# Patient Record
Sex: Male | Born: 1975 | Race: White | Hispanic: No | Marital: Married | Smoking: Current every day smoker
Health system: Southern US, Community
[De-identification: ages and names within clinical notes are randomized; demographics above are authoritative.]

---

## 2004-02-10 ENCOUNTER — Emergency Department (HOSPITAL_COMMUNITY): Admission: EM | Admit: 2004-02-10 | Discharge: 2004-02-10 | Payer: Self-pay | Admitting: Emergency Medicine

## 2007-07-19 ENCOUNTER — Emergency Department (HOSPITAL_COMMUNITY): Admission: EM | Admit: 2007-07-19 | Discharge: 2007-07-19 | Payer: Self-pay | Admitting: Emergency Medicine

## 2008-02-28 ENCOUNTER — Encounter: Admission: RE | Admit: 2008-02-28 | Discharge: 2008-02-28 | Payer: Self-pay | Admitting: Family Medicine

## 2008-04-10 ENCOUNTER — Encounter
Admission: RE | Admit: 2008-04-10 | Discharge: 2008-04-10 | Payer: Self-pay | Admitting: Physical Medicine and Rehabilitation

## 2009-07-31 ENCOUNTER — Encounter: Admission: RE | Admit: 2009-07-31 | Discharge: 2009-07-31 | Payer: Self-pay | Admitting: Family Medicine

## 2010-04-13 ENCOUNTER — Emergency Department (HOSPITAL_COMMUNITY)
Admission: EM | Admit: 2010-04-13 | Discharge: 2010-04-14 | Disposition: A | Discharge status: Home / Self Care | Payer: 59 | Attending: Emergency Medicine | Admitting: Emergency Medicine

## 2010-04-13 DIAGNOSIS — R4182 Altered mental status, unspecified: Secondary | ICD-10-CM | POA: Insufficient documentation

## 2010-04-13 DIAGNOSIS — F1010 Alcohol abuse, uncomplicated: Secondary | ICD-10-CM | POA: Insufficient documentation

## 2010-04-13 LAB — ETHANOL: Alcohol, Ethyl (B): 176 mg/dL — ABNORMAL HIGH (ref 0–10)

## 2010-04-14 LAB — RAPID URINE DRUG SCREEN, HOSP PERFORMED
Amphetamines: NOT DETECTED
Barbiturates: NOT DETECTED
Benzodiazepines: NOT DETECTED
Opiates: NOT DETECTED
Tetrahydrocannabinol: NOT DETECTED

## 2014-08-02 ENCOUNTER — Other Ambulatory Visit: Payer: Self-pay | Admitting: Family Medicine

## 2014-08-02 DIAGNOSIS — R110 Nausea: Secondary | ICD-10-CM

## 2014-08-02 DIAGNOSIS — R1011 Right upper quadrant pain: Secondary | ICD-10-CM

## 2014-08-07 ENCOUNTER — Ambulatory Visit (HOSPITAL_COMMUNITY)
Admission: RE | Admit: 2014-08-07 | Discharge: 2014-08-07 | Disposition: A | Discharge status: Home / Self Care | Payer: 59 | Source: Ambulatory Visit | Attending: Family Medicine | Admitting: Family Medicine

## 2014-08-07 ENCOUNTER — Other Ambulatory Visit (HOSPITAL_COMMUNITY): Payer: Self-pay | Admitting: Family Medicine

## 2014-08-07 DIAGNOSIS — R509 Fever, unspecified: Secondary | ICD-10-CM

## 2014-08-07 DIAGNOSIS — R112 Nausea with vomiting, unspecified: Secondary | ICD-10-CM | POA: Diagnosis not present

## 2014-08-07 DIAGNOSIS — R1011 Right upper quadrant pain: Secondary | ICD-10-CM

## 2014-08-07 DIAGNOSIS — R197 Diarrhea, unspecified: Secondary | ICD-10-CM

## 2014-08-07 DIAGNOSIS — R110 Nausea: Secondary | ICD-10-CM

## 2014-08-07 DIAGNOSIS — K760 Fatty (change of) liver, not elsewhere classified: Secondary | ICD-10-CM | POA: Insufficient documentation

## 2014-08-11 ENCOUNTER — Other Ambulatory Visit: Payer: Self-pay

## 2014-09-08 ENCOUNTER — Other Ambulatory Visit: Payer: Self-pay | Admitting: Gastroenterology

## 2014-09-08 DIAGNOSIS — R1011 Right upper quadrant pain: Secondary | ICD-10-CM

## 2014-09-26 ENCOUNTER — Ambulatory Visit (HOSPITAL_COMMUNITY)
Admission: RE | Admit: 2014-09-26 | Discharge: 2014-09-26 | Disposition: A | Discharge status: Home / Self Care | Payer: 59 | Source: Ambulatory Visit | Attending: Gastroenterology | Admitting: Gastroenterology

## 2014-09-26 DIAGNOSIS — R1011 Right upper quadrant pain: Secondary | ICD-10-CM | POA: Diagnosis present

## 2014-09-26 MED ORDER — STERILE WATER FOR INJECTION IJ SOLN
INTRAMUSCULAR | Status: AC
  Filled 2014-09-26: qty 10

## 2014-09-26 MED ORDER — TECHNETIUM TC 99M MEBROFENIN IV KIT
5.0000 | PACK | Freq: Once | INTRAVENOUS | Status: AC | PRN
  Administered 2014-09-26: 5 via INTRAVENOUS

## 2014-09-26 MED ORDER — SINCALIDE 5 MCG IJ SOLR
INTRAMUSCULAR | Status: AC
  Filled 2014-09-26: qty 5

## 2014-10-09 ENCOUNTER — Other Ambulatory Visit: Payer: Self-pay | Admitting: Gastroenterology

## 2014-10-09 DIAGNOSIS — R1084 Generalized abdominal pain: Secondary | ICD-10-CM

## 2014-10-10 ENCOUNTER — Ambulatory Visit
Admission: RE | Admit: 2014-10-10 | Discharge: 2014-10-10 | Disposition: A | Discharge status: Home / Self Care | Payer: 59 | Source: Ambulatory Visit | Attending: Gastroenterology | Admitting: Gastroenterology

## 2014-10-10 DIAGNOSIS — R1084 Generalized abdominal pain: Secondary | ICD-10-CM

## 2014-10-10 MED ORDER — IOPAMIDOL (ISOVUE-300) INJECTION 61%
100.0000 mL | Freq: Once | INTRAVENOUS | Status: AC | PRN
  Administered 2014-10-10: 100 mL via INTRAVENOUS

## 2016-03-26 ENCOUNTER — Other Ambulatory Visit: Payer: Self-pay | Admitting: Neurology

## 2016-03-26 DIAGNOSIS — M6281 Muscle weakness (generalized): Secondary | ICD-10-CM

## 2016-03-26 DIAGNOSIS — R200 Anesthesia of skin: Secondary | ICD-10-CM

## 2016-04-01 ENCOUNTER — Ambulatory Visit (HOSPITAL_COMMUNITY)
Admission: RE | Admit: 2016-04-01 | Discharge: 2016-04-01 | Disposition: A | Discharge status: Home / Self Care | Payer: 59 | Source: Ambulatory Visit | Attending: Neurology | Admitting: Neurology

## 2016-04-01 DIAGNOSIS — M6281 Muscle weakness (generalized): Secondary | ICD-10-CM

## 2016-04-01 DIAGNOSIS — G114 Hereditary spastic paraplegia: Secondary | ICD-10-CM | POA: Insufficient documentation

## 2016-04-01 DIAGNOSIS — M2578 Osteophyte, vertebrae: Secondary | ICD-10-CM | POA: Insufficient documentation

## 2016-04-01 DIAGNOSIS — G589 Mononeuropathy, unspecified: Secondary | ICD-10-CM | POA: Insufficient documentation

## 2016-04-01 DIAGNOSIS — M50223 Other cervical disc displacement at C6-C7 level: Secondary | ICD-10-CM | POA: Diagnosis not present

## 2016-04-01 DIAGNOSIS — M4802 Spinal stenosis, cervical region: Secondary | ICD-10-CM | POA: Diagnosis not present

## 2016-04-01 DIAGNOSIS — R200 Anesthesia of skin: Secondary | ICD-10-CM

## 2016-04-01 MED ORDER — GADOBENATE DIMEGLUMINE 529 MG/ML IV SOLN
20.0000 mL | Freq: Once | INTRAVENOUS | Status: AC | PRN
  Administered 2016-04-01: 17 mL via INTRAVENOUS

## 2016-05-14 ENCOUNTER — Encounter: Payer: Self-pay | Admitting: Physical Therapy

## 2016-05-14 ENCOUNTER — Ambulatory Visit: Payer: 59 | Attending: Neurology | Admitting: Physical Therapy

## 2016-05-14 DIAGNOSIS — M6281 Muscle weakness (generalized): Secondary | ICD-10-CM

## 2016-05-14 DIAGNOSIS — G478 Other sleep disorders: Secondary | ICD-10-CM | POA: Diagnosis present

## 2016-05-14 NOTE — Addendum Note (Signed)
Addended by: Ezekiel InaMANSFIELD, Astra Gregg S on: 05/14/2016 05:52 PM   Modules accepted: Orders

## 2016-05-14 NOTE — Therapy (Addendum)
[facility] MAIN [facility] SERVICES [address] [place], [place], [address] Phone: [phone]   Fax:  [phone]  Physical Therapy Evaluation  Patient Details  Name: Shane Bass MRN: [id number] Date of Birth: 1975/10/07 Referring Provider: Cristopher Peru K  Encounter Date: 05/14/2016      PT End of Session - 05/14/16 1606    Visit Number 1   Number of Visits 17   Date for PT Re-Evaluation 07/09/16   Authorization Type UHC   PT Start Time 0345   PT Stop Time 0445   PT Time Calculation (min) 60 min   Activity Tolerance Patient tolerated treatment well;Treatment limited secondary to medical complications (Comment)      History reviewed. No pertinent past medical history.  History reviewed. No pertinent surgical history.  There were no vitals filed for this visit.       Subjective Assessment - 05/14/16 1554    Subjective Patient reports that his right knee cap "pops".  He has pain that travels up his leg and down his legs. His legs sometimes feel like it is giving away.    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 He  had therapy  a year and a half for stretching and now he feels like he is getting worse.  He feels like his legs are getting tighter and the  right leg feels like it can give away. Patient states that his symptoms have been present for years. He started having numbness, tingling, pain, weakness, and decreased flexibility which started in his legs bilaterally. Patient has aching pain, sensation of swollen body part, weakness, cramps, twitching of muscle and urinary urgency. Patient says that his symptoms came on gradually and and has gotten worse. Patient says stretching and taking meloxicam makes him better. Patient has not had any imaging done for symptoms.    Limitations Walking;Standing   How long can you sit comfortably? unlimited   How  long can you stand comfortably? most of the day   How long can you walk comfortably? most of the day , on and off   Patient Stated Goals to be able to touch his toes and walk better   Currently in Pain? Yes   Pain Score 0-No pain   Aggravating Factors  reaching for his toes   Pain Relieving Factors sitting   Effect of Pain on Daily Activities stops him from doing any sports, he limits himself from beng active            [facility] PT Assessment - 05/14/16 0001      Assessment   Medical Diagnosis Hereditary Spastic Paraparisis   Referring Provider shah hemang K   Onset Date/Surgical Date 04/30/16   Hand Dominance Right   Next MD Visit [facility], HEMANG K      Balance Screen   Has the patient fallen in the past 6 months No   Has the patient had a decrease in activity level because of a fear of falling?  Yes   Is the patient reluctant to leave their home because of a fear of falling?  No     Home Nurse, mental health Private residence   Living Arrangements Spouse/significant other   Available Help at Discharge Family   Type of Home House   Home Access Stairs to enter   Entrance Stairs-Number of Steps --  2   Entrance Stairs-Rails  None   Home Layout One level     Prior Function   Level of Independence Independent   Vocation Full time employment   Vocation Requirements --  walking, carrying, climbing steps, climbing ladder      PAIN: no reports of pain  POSTURE: WNL   PROM/AROM:  Decreased  Hamstring flexibility 0-70 deg RLE, 0-80 deg RLE  STRENGTH:  Graded on a 0-5 scale Muscle Group Left Right                          Hip Flex 5/5 5/5  Hip Abd 5/5 4/5  Hip Add 4/5 4/5  Hip Ext 5/5 5/5  Hip IR/ER 5/5 5/5  Knee Flex 5/5 4/5  Knee Ext 4/5 4/5  Ankle DF 5/5 5/5  Ankle PF 5/5 5/5   SENSATION: WNL   FUNCTIONAL MOBILITY: independent   BALANCE:WNL   GAIT: WNL  LEFS: need to have completed at next visit   ,.                     PT Education - 05/14/16 1606    Education provided Yes   Education Details plan of care   Person(s) Educated Patient   Methods Explanation   Comprehension Verbalized understanding             PT Long Term Goals - 05/14/16 1734      PT LONG TERM GOAL #1   Title Patient will be independent in home exercise program to improve strength/mobility for better functional independence with ADLs.   Time 8   Period Weeks   Status New     PT LONG TERM GOAL #2   Title Patient will increase BLE gross strength to 4+/5 as to improve functional strength for independent gait, increased standing tolerance and increased ADL ability   Baseline 4/5 right knee flex/ext   Time 8   Status New     PT LONG TERM GOAL #3   Title Patient will increase lower extremity functional scale to >60/80 to demonstrate improved functional mobility and increased tolerance with ADLs.    Time 8   Period Weeks   Status New     PT LONG TERM GOAL #4   Title Patient will report a worst pain of 3/10 on VAS in RLE to improve tolerance with ADLs and reduced symptoms with activities.    Baseline variable 6/10    Time 8   Period Weeks   Status New               Plan - 05/14/16 1635    Clinical Impression Statement Patient is 41 yr old male with dx of Hereditary Spastic Paraparisis. He has tightness in BLE hamstrings, decreased RLE knee flex and extension strength. He reports right knee popping during ascending and descending steps. Patient will benefit from skilled PT to improve flexibiity in BLE hamstring muscles and strengthen RLE to be able to reach goals.    Rehab Potential Good   Clinical Impairments Affecting Rehab Potential This patient presents with 1 personal factors/ comorbidities current situation, and 2 body elements including body structures and functions, activity limitations and or participation restrictions: decreased strength RLE and decreased flexibility in BLE hamstring. Patient's condition is  stable,.   PT Frequency 2x / week   PT Duration 8 weeks   PT Treatment/Interventions Patient/family education;Therapeutic activities;Therapeutic exercise;Ultrasound;Moist Heat;Electrical Stimulation;Cryotherapy;Aquatic Therapy   PT Next Visit Plan RLE quad strengthening  PT Home Exercise Plan interval SLR, hamstring stretching   Consulted and Agree with Plan of Care Patient      Patient will benefit from skilled therapeutic intervention in order to improve the following deficits and impairments:  Difficulty walking, Decreased range of motion, Decreased activity tolerance, Decreased strength, Impaired flexibility, Pain  Visit Diagnosis: Muscle weakness (generalized) - Plan: PT plan of care cert/re-cert  Difficulty waking - Plan: PT plan of care cert/re-cert     Problem List There are no active problems to display for this patient.  [name], PT, DPT [place], Barkley Bruns S 05/14/2016, 5:43 PM  [place] [facility] MAIN [facility] SERVICES [address] [place], [place], [address] Phone: [phone]   Fax:  [phone]  Name: Shane Bass MRN: [id number] Date of Birth: December 01, 1975

## 2016-05-20 ENCOUNTER — Ambulatory Visit: Payer: 59 | Admitting: Physical Therapy

## 2016-05-20 ENCOUNTER — Encounter: Payer: Self-pay | Admitting: Physical Therapy

## 2016-05-20 DIAGNOSIS — G478 Other sleep disorders: Secondary | ICD-10-CM

## 2016-05-20 DIAGNOSIS — M6281 Muscle weakness (generalized): Secondary | ICD-10-CM

## 2016-05-20 NOTE — Therapy (Signed)
[facility] MAIN [facility] SERVICES [address] [place], [place], [address] Phone: [phone]   Fax:  [phone]  Physical Therapy Treatment  Patient Details  Name: Shane Bass MRN: [id number] Date of Birth: 1976/02/02 Referring Provider: Cristopher Peru K  Encounter Date: 05/20/2016      PT End of Session - 05/20/16 1632    Visit Number 2   Number of Visits 17   Date for PT Re-Evaluation 07/09/16   Authorization Type UHC   PT Start Time 0430   PT Stop Time 0510   PT Time Calculation (min) 40 min   Activity Tolerance Patient tolerated treatment well;Treatment limited secondary to medical complications (Comment)      History reviewed. No pertinent past medical history.  History reviewed. No pertinent surgical history.  There were no vitals filed for this visit.      Subjective Assessment - 05/20/16 1630    Subjective Patient reports that he has been doing his stretches. He is not having any pain today.   Pertinent History Patient says that he cant take long steps because his leg feel so tight. He has had therapy  a year and a half for stretching. He feels like his legs are getting tighter and the  right leg feels like it can give away.    Limitations Walking;Standing   How long can you sit comfortably? unlimited   How long can you stand comfortably? most of the day   How long can you walk comfortably? most of the day , on and off   Patient Stated Goals to be able to touch his toes and walk better   Currently in Pain? No/denies   Pain Score 0-No pain     Treatment: Hamstring stretching on step x 30 sec x 3 BLE gastroc/ soleus stretch  X 30 x 3 Low back stretch x 30 sec x 3  SLR intervals x 2 with 3 lbs BLE x 10  sidelying hip abd with 3 lbs x 20 BLE Hip abd/ER with GTB x 20 hooklying marching x 20  Leg press x 100 lbs x 20 BLE and heel raises x 20 x 3 Matrix hip ext x 20 , hip abd x 20 left and right x 20 x 2  Patient needs  cues for posture correction and for correct exercise technique.                           PT Education - 05/20/16 1631    Education provided Yes   Education Details HEP   Person(s) Educated Patient   Methods Explanation   Comprehension Verbalized understanding             PT Long Term Goals - 05/14/16 1734      PT LONG TERM GOAL #1   Title Patient will be independent in home exercise program to improve strength/mobility for better functional independence with ADLs.   Time 8   Period Weeks   Status New     PT LONG TERM GOAL #2   Title Patient will increase BLE gross strength to 4+/5 as to improve functional strength for independent gait, increased standing tolerance and increased ADL ability   Baseline 4/5 right knee flex/ext   Time 8   Status New     PT LONG TERM GOAL #3   Title Patient will increase lower extremity functional scale to >60/80 to demonstrate improved functional mobility and increased tolerance with  ADLs.    Time 8   Period Weeks   Status New     PT LONG TERM GOAL #4   Title Patient will report a worst pain of 3/10 on VAS in RLE to improve tolerance with ADLs and reduced symptoms with activities.    Baseline variable 6/10    Time 8   Period Weeks   Status New               Plan - 05/20/16 1632    Clinical Impression Statement Patient instructed in LE stretching and strengthening iwth minimal cues for posture and correct exercise technique and positions. Patient will benefit from skiled PT to improve flexibility and increase strength.    Rehab Potential Good   Clinical Impairments Affecting Rehab Potential This patient presents with 1 personal factors/ comorbidities current situation, and 2 body elements including body structures and functions, activity limitations and or participation restrictions: decreased strength RLE and decreased flexibility in BLE hamstring. Patient's condition is stable,.   PT Frequency 2x / week    PT Duration 8 weeks   PT Treatment/Interventions Patient/family education;Therapeutic activities;Therapeutic exercise;Ultrasound;Moist Heat;Electrical Stimulation;Cryotherapy;Aquatic Therapy   PT Next Visit Plan RLE quad strengthening   PT Home Exercise Plan interval SLR, hamstring stretching   Consulted and Agree with Plan of Care Patient      Patient will benefit from skilled therapeutic intervention in order to improve the following deficits and impairments:  Difficulty walking, Decreased range of motion, Decreased activity tolerance, Decreased strength, Impaired flexibility, Pain  Visit Diagnosis: Muscle weakness (generalized)  Difficulty waking     Problem List There are no active problems to display for this patient.  Ezekiel InaKristine S Armoni Kludt, PT, DPT [place], [place] 05/20/2016, 5:11 PM  [place] [facility]IN [facility] [address] [place], [place], [address] Phone: [phone]   Fax:  [phone]  Name: Cornell BarmanBradley Corne MRN: [id number] Date of Birth: May 14, 1975

## 2016-05-22 ENCOUNTER — Ambulatory Visit: Payer: 59 | Admitting: Physical Therapy

## 2016-05-22 ENCOUNTER — Encounter: Payer: Self-pay | Admitting: Physical Therapy

## 2016-05-22 ENCOUNTER — Ambulatory Visit: Payer: 59 | Attending: Neurology | Admitting: Physical Therapy

## 2016-05-22 DIAGNOSIS — G478 Other sleep disorders: Secondary | ICD-10-CM | POA: Insufficient documentation

## 2016-05-22 DIAGNOSIS — M6281 Muscle weakness (generalized): Secondary | ICD-10-CM | POA: Diagnosis present

## 2016-05-22 NOTE — Therapy (Signed)
[facility] MAIN [facility] [address] [place], [place], [address] Phone: [phone]   Fax:  [phone]  Physical Therapy Treatment  Patient Details  Name: Shane Bass MRN: [id number] Date of Birth: 03-08-1975 Referring Provider: Cristopher Perushah hemang Bass  Encounter Date: 05/22/2016      PT End of Session - 05/22/16 1535    Visit Number 3   Number of Visits 17   Date for PT Re-Evaluation 07/09/16   Authorization Type UHC   PT Start Time 0330   PT Stop Time 0415   PT Time Calculation (min) 45 min   Equipment Utilized During Treatment Gait belt   Activity Tolerance Patient tolerated treatment well;Treatment limited secondary to medical complications (Comment)      History reviewed. No pertinent past medical history.  History reviewed. No pertinent surgical history.  There were no vitals filed for this visit.      Subjective Assessment - 05/22/16 1531    Subjective Patient reports that he has been doing his stretches. He is not having any pain today.   Pertinent History Patient says that he cant take long steps because his leg feel so tight. He has had therapy  a year and a half for stretching. He feels like his legs are getting tighter and the  right leg feels like it can give away.    Limitations Walking;Standing   How long can you sit comfortably? unlimited   How long can you stand comfortably? most of the day   How long can you walk comfortably? most of the day , on and off   Patient Stated Goals to be able to touch his toes and walk better   Currently in Pain? Yes   Pain Score 4    Pain Location Buttocks   Pain Orientation Right   Pain Descriptors / Indicators Sore   Pain Type Acute pain   Pain Radiating Towards not radiating   Pain Onset Today   Pain Frequency Constant   Aggravating Factors  reaching for his toes   Pain Relieving Factors sitting   Effect of Pain on Daily Activities stops him from running        Treatment BOSU ball flat side up, flat side down with UE support and squats x 15 x 2; cues for upright posture BOSU ball lunges x 15 BLE with cues for upright posture tandem standing on green and blue disk with trunk rotation and cues for upright posture Leg press 150 lbs x 20 x 3, heel raises 150 lbs x 20 x 3 TM walking side stepping left and right x 3 mins each direction with elevation 2 , x 3 mins left and right , UE support TM walking elevation 2, 5 mins left and right with cue for looking ahead and UE support matix 12. 5 lbs hip exprior to t/ hip abd x 10 x 2 BLE; cues for posture and keeping leg in extension Patient is having pain today , but is able to perform all exercises and does have some fatigue.                            PT Education - 05/22/16 1534    Education provided Yes   Education Details HEP   Person(Bass) Educated Patient   Methods Explanation   Comprehension Verbalized understanding             PT Long Term Goals - 05/14/16 1734  PT LONG TERM GOAL #1   Title Patient will be independent in home exercise program to improve strength/mobility for better functional independence with ADLs.   Time 8   Period Weeks   Status New     PT LONG TERM GOAL #2   Title Patient will increase BLE gross strength to 4+/5 as to improve functional strength for independent gait, increased standing tolerance and increased ADL ability   Baseline 4/5 right knee flex/ext   Time 8   Status New     PT LONG TERM GOAL #3   Title Patient will increase lower extremity functional scale to >60/80 to demonstrate improved functional mobility and increased tolerance with ADLs.    Time 8   Period Weeks   Status New     PT LONG TERM GOAL #4   Title Patient will report a worst pain of 3/10 on VAS in RLE to improve tolerance with ADLs and reduced symptoms with activities.    Baseline variable 6/10    Time 8   Period Weeks   Status New                Plan - 05/22/16 1536    Clinical Impression Statement Patient presents with unsteadiness in gait and weakness in BLE and fatigues with therapeutic exercises.  Patient tolerated  interventions for strengtening well this date and will benefit from continued skilled PT interventions to improve strength .   Rehab Potential Good   Clinical Impairments Affecting Rehab Potential This patient presents with 1 personal factors/ comorbidities current situation, and 2 body elements including body structures and functions, activity limitations and or participation restrictions: decreased strength RLE and decreased flexibility in BLE hamstring. Patient'Bass condition is stable,.   PT Frequency 2x / week   PT Duration 8 weeks   PT Treatment/Interventions Patient/family education;Therapeutic activities;Therapeutic exercise;Ultrasound;Moist Heat;Electrical Stimulation;Cryotherapy;Aquatic Therapy   PT Next Visit Plan RLE quad strengthening   PT Home Exercise Plan interval SLR, hamstring stretching   Consulted and Agree with Plan of Care Patient      Patient will benefit from skilled therapeutic intervention in order to improve the following deficits and impairments:  Difficulty walking, Decreased range of motion, Decreased activity tolerance, Decreased strength, Impaired flexibility, Pain  Visit Diagnosis: Muscle weakness (generalized)  Difficulty waking     Problem List There are no active problems to display for this patient.  Shane Bass Shane Bass, Shane Bass 05/22/2016, 3:47 PM  [facility] MAIN [facility] SERVICES [address] [place], [place], [address] Phone: [phone]   Fax:  [phone]  Name: Shane Bass MRN: [id number] Date of Birth: 04/16/1975

## 2016-05-27 ENCOUNTER — Ambulatory Visit: Payer: 59 | Attending: Neurology | Admitting: Physical Therapy

## 2016-05-27 ENCOUNTER — Encounter: Payer: Self-pay | Admitting: Physical Therapy

## 2016-05-27 DIAGNOSIS — M6281 Muscle weakness (generalized): Secondary | ICD-10-CM | POA: Diagnosis not present

## 2016-05-27 DIAGNOSIS — G478 Other sleep disorders: Secondary | ICD-10-CM | POA: Insufficient documentation

## 2016-05-27 NOTE — Therapy (Addendum)
[facility] MAIN [facility] SERVICES [address] [place], [place], [address] Phone: [phone]   Fax:  [phone]  Physical Therapy Treatment  Patient Details  Name: Shane Bass MRN: [id number] Date of Birth: 03/20/1975 Referring Provider: Cristopher Peru K  Encounter Date: 05/27/2016      PT End of Session - 05/27/16 1650    Visit Number 4   Number of Visits 17   Date for PT Re-Evaluation 07/09/16   Authorization Type UHC   PT Start Time 0445   PT Stop Time 0525   PT Time Calculation (min) 40 min   Equipment Utilized During Treatment Gait belt   Activity Tolerance Patient tolerated treatment well;Treatment limited secondary to medical complications (Comment)      History reviewed. No pertinent past medical history.  History reviewed. No pertinent surgical history.  There were no vitals filed for this visit.      Subjective Assessment - 05/27/16 1649    Subjective Patient reports that he has been doing his stretches. He is having intermittent right glut pain today, but it is not hurting right now.   Pertinent History Patient says that he cant take long steps because his leg feel so tight. He has had therapy  a year and a half for stretching. He feels like his legs are getting tighter and the  right leg feels like it can give away.    Limitations Walking;Standing   How long can you sit comfortably? unlimited   How long can you stand comfortably? most of the day   How long can you walk comfortably? most of the day , on and off   Patient Stated Goals to be able to touch his toes and walk better   Currently in Pain? No/denies   Pain Score 0-No pain   Pain Onset Today   Multiple Pain Sites No      [facility] center machines: knee extension 3 plates x 20 x 2 Knee flex 5 plates x 20 x 2 Leg press  Plate  8 x 20 x 3 Heel raises plate  8 x 20 x 2 Standing on 1/2 foam with yellow theraball and TA contraction x 1 minute intervals  Tandem  stand on blue foam with yellow theraball x 1 min intervals, cues for posture correction BOSU ball flat side down and flat side up and squats x 20 x 2 Matrix leg ext/ leg abd BLE 12. 5 lbs x 20, cues for upright posture and technque                           PT Education - 05/27/16 1650    Education provided Yes   Education Details HEP   Person(s) Educated Patient   Methods Explanation   Comprehension Verbalized understanding             PT Long Term Goals - 05/14/16 1734      PT LONG TERM GOAL #1   Title Patient will be independent in home exercise program to improve strength/mobility for better functional independence with ADLs.   Time 8   Period Weeks   Status New     PT LONG TERM GOAL #2   Title Patient will increase BLE gross strength to 4+/5 as to improve functional strength for independent gait, increased standing tolerance and increased ADL ability   Baseline 4/5 right knee flex/ext   Time 8   Status New  PT LONG TERM GOAL #3   Title Patient will increase lower extremity functional scale to >60/80 to demonstrate improved functional mobility and increased tolerance with ADLs.    Time 8   Period Weeks   Status New     PT LONG TERM GOAL #4   Title Patient will report a worst pain of 3/10 on VAS in RLE to improve tolerance with ADLs and reduced symptoms with activities.    Baseline variable 6/10    Time 8   Period Weeks   Status New               Plan - 05/27/16 1650    Clinical Impression Statement Patient instructed in LE strengthening exercises and  stretching for BLE.He has weakness in closed chain exercises and decreased power evidenced poor quality squat and trembling LE's during exericses.  He will continue to benefit from skilled PT to improve strength and be steady with gait.   Rehab Potential Good   Clinical Impairments Affecting Rehab Potential This patient presents with 1 personal factors/ comorbidities current  situation, and 2 body elements including body structures and functions, activity limitations and or participation restrictions: decreased strength RLE and decreased flexibility in BLE hamstring. Patient's condition is stable,.   PT Frequency 2x / week   PT Duration 8 weeks   PT Treatment/Interventions Patient/family education;Therapeutic activities;Therapeutic exercise;Ultrasound;Moist Heat;Electrical Stimulation;Cryotherapy;Aquatic Therapy   PT Next Visit Plan RLE quad strengthening   PT Home Exercise Plan interval SLR, hamstring stretching   Consulted and Agree with Plan of Care Patient      Patient will benefit from skilled therapeutic intervention in order to improve the following deficits and impairments:  Difficulty walking, Decreased range of motion, Decreased activity tolerance, Decreased strength, Impaired flexibility, Pain  Visit Diagnosis: Muscle weakness (generalized)  Difficulty waking     Problem List There are no active problems to display for this patient. [name], PT, DPT  [name], [place] S 05/27/2016, 4:55 PM  [facility] MAIN [facility] SERVICES [address] [place], [place], [address] Phone: [phone]   Fax:  [phone]  Name: Shane Bass MRN: [id number] Date of Birth: [birth year]

## 2016-05-29 ENCOUNTER — Ambulatory Visit: Payer: 59 | Admitting: Physical Therapy

## 2016-06-02 ENCOUNTER — Ambulatory Visit: Payer: 59 | Admitting: Physical Therapy

## 2016-06-02 ENCOUNTER — Encounter: Payer: Self-pay | Admitting: Physical Therapy

## 2016-06-02 DIAGNOSIS — M6281 Muscle weakness (generalized): Secondary | ICD-10-CM | POA: Diagnosis not present

## 2016-06-02 DIAGNOSIS — G478 Other sleep disorders: Secondary | ICD-10-CM

## 2016-06-02 NOTE — Therapy (Signed)
[facility] MAIN [facility] SERVICES [address] [place], [place], [address] Phone: [phone]   Fax:  [phone]  Physical Therapy Treatment  Patient Details  Name: Shane Bass MRN: [id number] Date of Birth: September 24, 1975 Referring Provider: Cristopher Peru K  Encounter Date: 06/02/2016      PT End of Session - 06/02/16 1609    Visit Number 5   Number of Visits 17   Date for PT Re-Evaluation 07/09/16   Authorization Type UHC   PT Start Time 0400   PT Stop Time 0445   PT Time Calculation (min) 45 min   Equipment Utilized During Treatment Gait belt   Activity Tolerance Patient tolerated treatment well;Treatment limited secondary to medical complications (Comment)   Behavior During Therapy [facility] for tasks assessed/performed      History reviewed. No pertinent past medical history.  History reviewed. No pertinent surgical history.  There were no vitals filed for this visit.      Subjective Assessment - 06/02/16 1609    Subjective Patient reports that he has been doing his stretches. He is having intermittent right glut pain today, but it is not hurting right now.   Pertinent History Patient says that he cant take long steps because his leg feel so tight. He has had therapy  a year and a half for stretching. He feels like his legs are getting tighter and the  right leg feels like it can give away.    Limitations Walking;Standing   How long can you sit comfortably? unlimited   How long can you stand comfortably? most of the day   How long can you walk comfortably? most of the day , on and off   Patient Stated Goals to be able to touch his toes and walk better   Currently in Pain? No/denies   Pain Score 0-No pain   Pain Onset Today   Multiple Pain Sites No      Treatment BOSU ball flat side up, flat side down with UE support and squats x 15 x 2; cues for upright posture BOSU ball lunges x 15 BLE with cues for upright posture tandem standing  on green and blue disk with trunk rotation and cues for upright posture Leg press 150 lbs x 20 x 3, heel raises 150 lbs x 20 x 3 TM walking side stepping left and right x 3 mins each direction with elevation 2 , x 3 mins left and right , UE support TM walking elevation 2, 5 mins left and right with cue for looking ahead and UE support matix 12. 5 lbs hip ext and hip abd x 10 x 2 BLE; cues for posture and keeping leg in extension Patient is having pain today , but is able to perform all exercises and does have some fatigue.  leg extension/ flex with machine 3 plates/ 6 plates                          PT Education - 06/02/16 1609    Education provided Yes   Education Details HEP   Person(s) Educated Patient   Methods Explanation   Comprehension Verbalized understanding             PT Long Term Goals - 05/14/16 1734      PT LONG TERM GOAL #1   Title Patient will be independent in home exercise program to improve strength/mobility for better functional independence with ADLs.   Time  8   Period Weeks   Status New     PT LONG TERM GOAL #2   Title Patient will increase BLE gross strength to 4+/5 as to improve functional strength for independent gait, increased standing tolerance and increased ADL ability   Baseline 4/5 right knee flex/ext   Time 8   Status New     PT LONG TERM GOAL #3   Title Patient will increase lower extremity functional scale to >60/80 to demonstrate improved functional mobility and increased tolerance with ADLs.    Time 8   Period Weeks   Status New     PT LONG TERM GOAL #4   Title Patient will report a worst pain of 3/10 on VAS in RLE to improve tolerance with ADLs and reduced symptoms with activities.    Baseline variable 6/10    Time 8   Period Weeks   Status New               Plan - 06/02/16 1610    Clinical Impression Statement Patient instructed in advanced strengthening and balance exercise. She requires min Vcs  for correct exercise technique including to improve trunk control with standing exercise. Patient demonstrates better quad control with SLS tasks with rail assist. Patient would benefit from additional skilled PT intervention to improve balance/gait safety and reduce fall risk;   Rehab Potential Good   Clinical Impairments Affecting Rehab Potential This patient presents with 1 personal factors/ comorbidities current situation, and 2 body elements including body structures and functions, activity limitations and or participation restrictions: decreased strength RLE and decreased flexibility in BLE hamstring. Patient's condition is stable,.   PT Frequency 2x / week   PT Duration 8 weeks   PT Treatment/Interventions Patient/family education;Therapeutic activities;Therapeutic exercise;Ultrasound;Moist Heat;Electrical Stimulation;Cryotherapy;Aquatic Therapy   PT Next Visit Plan RLE quad strengthening   PT Home Exercise Plan interval SLR, hamstring stretching   Consulted and Agree with Plan of Care Patient      Patient will benefit from skilled therapeutic intervention in order to improve the following deficits and impairments:  Difficulty walking, Decreased range of motion, Decreased activity tolerance, Decreased strength, Impaired flexibility, Pain  Visit Diagnosis: Muscle weakness (generalized)  Difficulty waking     Problem List There are no active problems to display for this patient.  [name], PT, DPT [place], [place] 06/02/2016, 4:12 PM  [place] [facility] MAIN [facility] SERVICES [address] [place], [place], [address] Phone: [phone]   Fax:  [phone]  Name: Shane Bass MRN: [id number] Date of Birth: September 18, 1975

## 2016-06-04 ENCOUNTER — Encounter: Payer: Self-pay | Admitting: Physical Therapy

## 2016-06-04 ENCOUNTER — Ambulatory Visit: Payer: 59 | Admitting: Physical Therapy

## 2016-06-04 DIAGNOSIS — G478 Other sleep disorders: Secondary | ICD-10-CM

## 2016-06-04 DIAGNOSIS — M6281 Muscle weakness (generalized): Secondary | ICD-10-CM | POA: Diagnosis not present

## 2016-06-04 NOTE — Therapy (Signed)
[place] [facility] MAIN [facility] SERVICES [address] [place], [place], [address] Phone: [phone]   Fax:  [phone]  Physical Therapy Treatment  Patient Details  Name: Shane Bass MRN: [id number] Date of Birth: 07-29-1975 Referring Provider: Cristopher Peru K  Encounter Date: 06/04/2016      PT End of Session - 06/04/16 1611    Visit Number 6   Number of Visits 17   Date for PT Re-Evaluation 07/09/16   Authorization Type UHC   PT Start Time 0400   PT Stop Time 0445   PT Time Calculation (min) 45 min   Equipment Utilized During Treatment Gait belt   Activity Tolerance Patient tolerated treatment well;Treatment limited secondary to medical complications (Comment)   Behavior During Therapy [facility] for tasks assessed/performed      History reviewed. No pertinent past medical history.  History reviewed. No pertinent surgical history.  There were no vitals filed for this visit.      Subjective Assessment - 06/04/16 1610    Subjective Patient reports that he has been doing his stretches. He is having intermittent right glut pain today, but it is not hurting right now.   Pertinent History Patient says that he cant take long steps because his leg feel so tight. He has had therapy  a year and a half for stretching. He feels like his legs are getting tighter and the  right leg feels like it can give away.    Limitations Walking;Standing   How long can you sit comfortably? unlimited   How long can you stand comfortably? most of the day   How long can you walk comfortably? most of the day , on and off   Patient Stated Goals to be able to touch his toes and walk better   Currently in Pain? No/denies   Pain Score 0-No pain   Pain Onset Today   Multiple Pain Sites No      Treatment BOSU ball flat side up, flat side down with UE support and squats x 15 x 2; cues for upright posture BOSU ball lunges x 15 BLE with cues for upright posture tandem  standing on green and blue disk with trunk rotation and cues for upright posture Leg press 150 lbs x 20 x 3, heel raises 150 lbs x 20 x 3 TM walking side stepping left and right x 3 mins each direction with elevation 2 , x 3 mins left and right , UE support TM walking elevation 2, 5 mins left and right with cue for looking ahead and UE support matix 12. 5 lbs hip ext and hip abd x 10 x 2 BLE; cues for posture and keeping leg in extension Patient is having no  pain today ,  is able to perform all exercises and does have some fatigue.  leg extension/ flex with machine 3 plates/ 6 plates                            PT Education - 06/04/16 1611    Education provided Yes   Education Details stretching and progression of exercises   Person(s) Educated Patient   Methods Explanation   Comprehension Verbalized understanding             PT Long Term Goals - 05/14/16 1734      PT LONG TERM GOAL #1   Title Patient will be independent in home exercise program to improve strength/mobility for  better functional independence with ADLs.   Time 8   Period Weeks   Status New     PT LONG TERM GOAL #2   Title Patient will increase BLE gross strength to 4+/5 as to improve functional strength for independent gait, increased standing tolerance and increased ADL ability   Baseline 4/5 right knee flex/ext   Time 8   Status New     PT LONG TERM GOAL #3   Title Patient will increase lower extremity functional scale to >60/80 to demonstrate improved functional mobility and increased tolerance with ADLs.    Time 8   Period Weeks   Status New     PT LONG TERM GOAL #4   Title Patient will report a worst pain of 3/10 on VAS in RLE to improve tolerance with ADLs and reduced symptoms with activities.    Baseline variable 6/10    Time 8   Period Weeks   Status New               Plan - 06/04/16 1612    Clinical Impression Statement Patient demonstrates fair quadriceps  control and requires only minimal cueing to activate. Patient demonstrates decreased muscular strength in the L LE and will benefit from further skilled therapy focused on improving motor control and muscular strength/endurance to return to prior level of function.   Rehab Potential Good   Clinical Impairments Affecting Rehab Potential This patient presents with 1 personal factors/ comorbidities current situation, and 2 body elements including body structures and functions, activity limitations and or participation restrictions: decreased strength RLE and decreased flexibility in BLE hamstring. Patient's condition is stable,.   PT Frequency 2x / week   PT Duration 8 weeks   PT Treatment/Interventions Patient/family education;Therapeutic activities;Therapeutic exercise;Ultrasound;Moist Heat;Electrical Stimulation;Cryotherapy;Aquatic Therapy   PT Next Visit Plan RLE quad strengthening   PT Home Exercise Plan interval SLR, hamstring stretching   Consulted and Agree with Plan of Care Patient      Patient will benefit from skilled therapeutic intervention in order to improve the following deficits and impairments:  Difficulty walking, Decreased range of motion, Decreased activity tolerance, Decreased strength, Impaired flexibility, Pain  Visit Diagnosis: Muscle weakness (generalized)  Difficulty waking     Problem List There are no active problems to display for this patient.  [name], PT, DPT [employer], [place] 06/04/2016, 4:16 PM  [place] [facility] MAIN [facility] SERVICES [address] [place], [place], [address] Phone: [phone]   Fax:  [phone]  Name: Shane Bass MRN: [id number] Date of Birth: [birth year]

## 2016-06-10 ENCOUNTER — Encounter: Payer: Self-pay | Admitting: Physical Therapy

## 2016-06-10 ENCOUNTER — Ambulatory Visit: Payer: 59 | Admitting: Physical Therapy

## 2016-06-10 DIAGNOSIS — M6281 Muscle weakness (generalized): Secondary | ICD-10-CM

## 2016-06-10 DIAGNOSIS — G478 Other sleep disorders: Secondary | ICD-10-CM

## 2016-06-10 NOTE — Therapy (Signed)
[place] [facility] MAIN [facility] SERVICES [address] [place], [place], [address] Phone: [phone]   Fax:  [phone]  Physical Therapy Treatment  Patient Details  Name: Aceton Kinnear MRN: [id number] Date of Birth: 03/20/1975 Referring Provider: Cristopher Peru K  Encounter Date: 06/10/2016      PT End of Session - 06/10/16 1656    Visit Number 7   Number of Visits 17   Date for PT Re-Evaluation 07/09/16   Authorization Type UHC   PT Start Time 0451   PT Stop Time 0530   PT Time Calculation (min) 39 min   Equipment Utilized During Treatment Gait belt   Activity Tolerance Patient tolerated treatment well;Treatment limited secondary to medical complications (Comment)   Behavior During Therapy [facility] for tasks assessed/performed      History reviewed. No pertinent past medical history.  History reviewed. No pertinent surgical history.  There were no vitals filed for this visit.      Subjective Assessment - 06/10/16 1655    Subjective Patient reports that he has been doing his stretches. He is having intermittent right glut pain today, but it is not hurting right now.   Pertinent History Patient says that he cant take long steps because his leg feel so tight. He has had therapy  a year and a half for stretching. He feels like his legs are getting tighter and the  right leg feels like it can give away.    Limitations Walking;Standing   How long can you sit comfortably? unlimited   How long can you stand comfortably? most of the day   How long can you walk comfortably? most of the day , on and off   Patient Stated Goals to be able to touch his toes and walk better   Currently in Pain? No/denies   Pain Score 0-No pain   Pain Onset Today   Multiple Pain Sites No      Treatment BOSU ball flat side up, flat side down with UE support and squats x 15 x 2; cues for upright posture BOSU ball lunges x 15 BLE with cues for upright posture tandem  standing on green and blue disk with trunk rotation and cues for upright posture Leg press 150 lbs x 20 x 3, heel raises 150 lbs x 20 x 3 TM walking side stepping left and right x 3 mins each direction with elevation 2 , x 3 mins left and right , UE support TM walking elevation 2, 5 mins left and right with cue for looking ahead and UE support matix 12. 5 lbs hip ext and hip abd x 10 x 2 BLE; cues for posture and keeping leg in extension Patient is having no  pain today ,  is able to perform all exercises and does have some fatigue.  leg extension/ flex with machine 3 plates/ 6 plates Patient needs occasional verbal cueing to improve posture and cueing to correctly perform exercises slowly, holding at end of range to increase motor firing of desired muscle to encourage fatigue.                               PT Education - 06/10/16 1656    Education provided Yes   Education Details stretching and HEP   Person(s) Educated Patient   Methods Explanation   Comprehension Verbalized understanding             PT  Long Term Goals - 05/14/16 1734      PT LONG TERM GOAL #1   Title Patient will be independent in home exercise program to improve strength/mobility for better functional independence with ADLs.   Time 8   Period Weeks   Status New     PT LONG TERM GOAL #2   Title Patient will increase BLE gross strength to 4+/5 as to improve functional strength for independent gait, increased standing tolerance and increased ADL ability   Baseline 4/5 right knee flex/ext   Time 8   Status New     PT LONG TERM GOAL #3   Title Patient will increase lower extremity functional scale to >60/80 to demonstrate improved functional mobility and increased tolerance with ADLs.    Time 8   Period Weeks   Status New     PT LONG TERM GOAL #4   Title Patient will report a worst pain of 3/10 on VAS in RLE to improve tolerance with ADLs and reduced symptoms with activities.     Baseline variable 6/10    Time 8   Period Weeks   Status New               Plan - 06/10/16 1656    Clinical Impression Statement Focused on advancement in standing exercises today to improve LE strength and improving endurance. Patient demonstrates increased postural sway and LOB posteriorly requiring UEs to maintain balance and VCs to shift weight forward. Patient will benefit from further skilled therapy focused on improving strength and balance to return to prior level of function   Rehab Potential Good   Clinical Impairments Affecting Rehab Potential This patient presents with 1 personal factors/ comorbidities current situation, and 2 body elements including body structures and functions, activity limitations and or participation restrictions: decreased strength RLE and decreased flexibility in BLE hamstring. Patient's condition is stable,.   PT Frequency 2x / week   PT Duration 8 weeks   PT Treatment/Interventions Patient/family education;Therapeutic activities;Therapeutic exercise;Ultrasound;Moist Heat;Electrical Stimulation;Cryotherapy;Aquatic Therapy   PT Next Visit Plan RLE quad strengthening   PT Home Exercise Plan interval SLR, hamstring stretching   Consulted and Agree with Plan of Care Patient      Patient will benefit from skilled therapeutic intervention in order to improve the following deficits and impairments:  Difficulty walking, Decreased range of motion, Decreased activity tolerance, Decreased strength, Impaired flexibility, Pain  Visit Diagnosis: Muscle weakness (generalized)  Difficulty waking     Problem List There are no active problems to display for this patient.  [name], PT, DPT [place], [place] S 06/10/2016, 5:00 PM  [place] [facility] MAIN [facility] SERVICES [address] [place], [place], [address] Phone: [phone]   Fax:  [phone]  Name: Burley Kopka MRN: [id number] Date of Birth:  04-14-1975

## 2016-06-12 ENCOUNTER — Ambulatory Visit: Payer: 59 | Admitting: Physical Therapy

## 2016-06-17 ENCOUNTER — Ambulatory Visit: Payer: 59 | Admitting: Physical Therapy

## 2016-06-19 ENCOUNTER — Ambulatory Visit: Payer: 59 | Admitting: Physical Therapy

## 2016-06-26 ENCOUNTER — Ambulatory Visit: Payer: 59 | Attending: Neurology | Admitting: Physical Therapy

## 2016-07-17 ENCOUNTER — Ambulatory Visit: Payer: 59 | Admitting: Physical Therapy

## 2016-07-23 ENCOUNTER — Encounter: Payer: Self-pay | Admitting: Physical Therapy

## 2016-07-23 NOTE — Therapy (Signed)
[place] [facility] MAIN [facility] SERVICES [address] [place], [place], [address] Phone: [phone]   Fax:  [phone]  Patient Details  Name: Shane BarmanBradley Bass MRN: [id number] Date of Birth: [birth year] Referring Provider:  No ref. provider found  Encounter Date: 07/23/2016. Patient had frequent no show visits and [facility] and  his goals  could not be reassessed.  Ezekiel InaKristine S Gabryelle Whitmoyer, PT, DPT [place], [place]uns[place] S 07/23/2016, 4:35 PM  [place] [facility]IN [facility] [address] [place], [place], [address] Phone: [phone]   Fax:  [phone]

## 2016-07-24 ENCOUNTER — Ambulatory Visit: Payer: 59 | Admitting: Physical Therapy

## 2016-07-31 ENCOUNTER — Ambulatory Visit: Payer: 59 | Admitting: Physical Therapy

## 2016-08-28 ENCOUNTER — Other Ambulatory Visit (HOSPITAL_COMMUNITY): Payer: Self-pay | Admitting: Neurology

## 2016-08-28 DIAGNOSIS — M545 Low back pain, unspecified: Secondary | ICD-10-CM

## 2016-09-03 ENCOUNTER — Ambulatory Visit (HOSPITAL_COMMUNITY)
Admission: RE | Admit: 2016-09-03 | Discharge: 2016-09-03 | Disposition: A | Discharge status: Home / Self Care | Payer: 59 | Source: Ambulatory Visit | Attending: Neurology | Admitting: Neurology

## 2016-09-03 DIAGNOSIS — M545 Low back pain, unspecified: Secondary | ICD-10-CM

## 2016-09-03 DIAGNOSIS — M5124 Other intervertebral disc displacement, thoracic region: Secondary | ICD-10-CM | POA: Diagnosis not present

## 2017-06-16 ENCOUNTER — Other Ambulatory Visit (HOSPITAL_COMMUNITY): Payer: Self-pay | Admitting: Neurology

## 2017-06-16 DIAGNOSIS — G114 Hereditary spastic paraplegia: Secondary | ICD-10-CM

## 2017-06-26 ENCOUNTER — Ambulatory Visit (HOSPITAL_COMMUNITY)
Admission: RE | Admit: 2017-06-26 | Discharge: 2017-06-26 | Disposition: A | Discharge status: Home / Self Care | Payer: 59 | Source: Ambulatory Visit | Attending: Neurology | Admitting: Neurology

## 2017-06-26 DIAGNOSIS — M5124 Other intervertebral disc displacement, thoracic region: Secondary | ICD-10-CM | POA: Insufficient documentation

## 2017-06-26 DIAGNOSIS — G114 Hereditary spastic paraplegia: Secondary | ICD-10-CM | POA: Diagnosis present

## 2017-06-26 MED ORDER — GADOBENATE DIMEGLUMINE 529 MG/ML IV SOLN
18.0000 mL | Freq: Once | INTRAVENOUS | Status: AC | PRN
  Administered 2017-06-26: 18 mL via INTRAVENOUS

## 2018-10-19 ENCOUNTER — Other Ambulatory Visit: Payer: Self-pay | Admitting: Cardiovascular Disease

## 2018-10-19 ENCOUNTER — Other Ambulatory Visit: Payer: Self-pay | Admitting: Internal Medicine

## 2018-10-19 DIAGNOSIS — R7989 Other specified abnormal findings of blood chemistry: Secondary | ICD-10-CM

## 2018-10-22 ENCOUNTER — Ambulatory Visit
Admission: RE | Admit: 2018-10-22 | Discharge: 2018-10-22 | Disposition: A | Discharge status: Home / Self Care | Payer: 59 | Source: Ambulatory Visit | Attending: Internal Medicine | Admitting: Internal Medicine

## 2018-10-22 ENCOUNTER — Other Ambulatory Visit: Payer: Self-pay

## 2018-10-22 DIAGNOSIS — R7989 Other specified abnormal findings of blood chemistry: Secondary | ICD-10-CM | POA: Insufficient documentation

## 2019-12-05 IMAGING — US US THYROID
1 series · 14 of 25 positions shown · non-contrast
Comparison: None.

CLINICAL DATA: High TSH

EXAM:
THYROID ULTRASOUND
TECHNIQUE: Ultrasound examination of the thyroid gland and adjacent soft
tissues was performed.

[Series 1: us thyroid · 32 acquisitions, 14 frames shown]
[im 1/32]
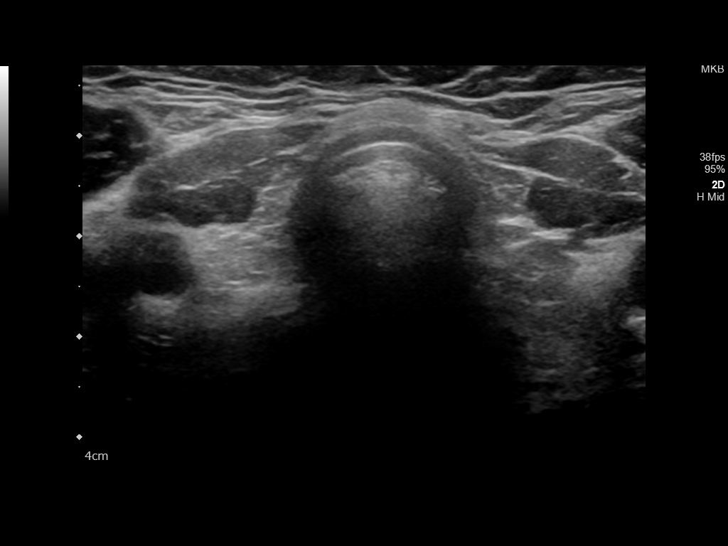
[im 3/32]
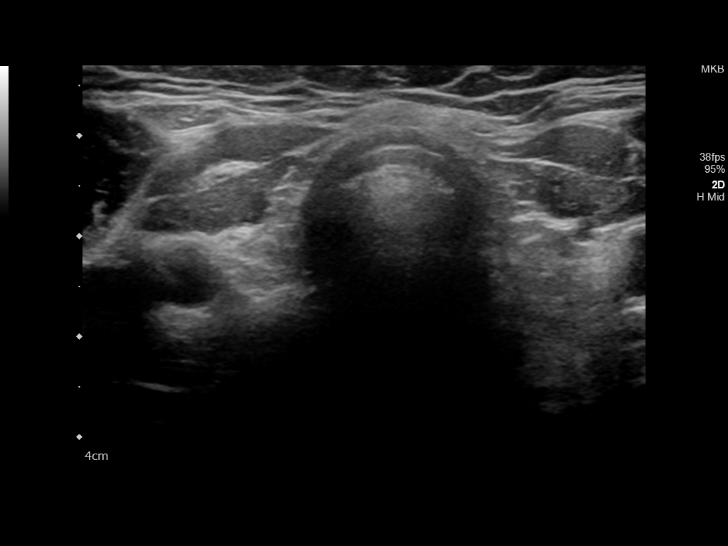
[im 6/32]
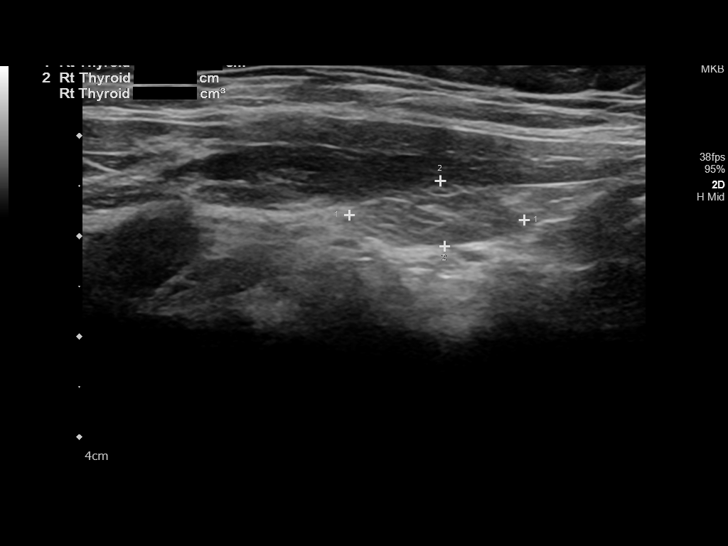
[im 8/32]
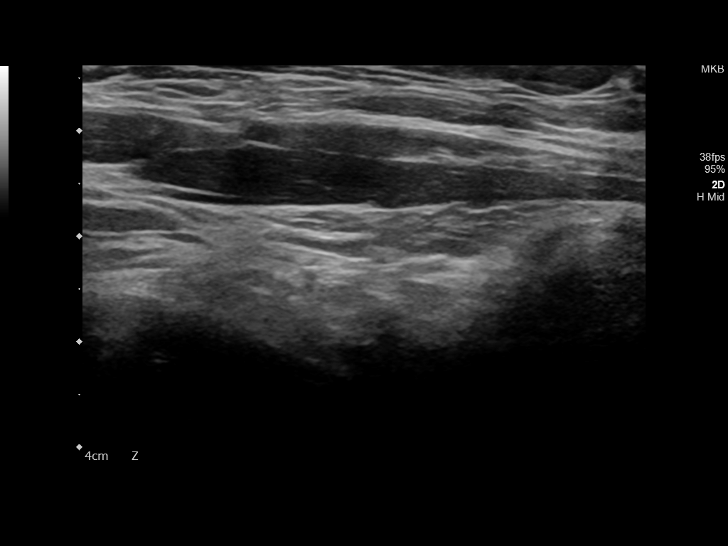
[im 11/32]
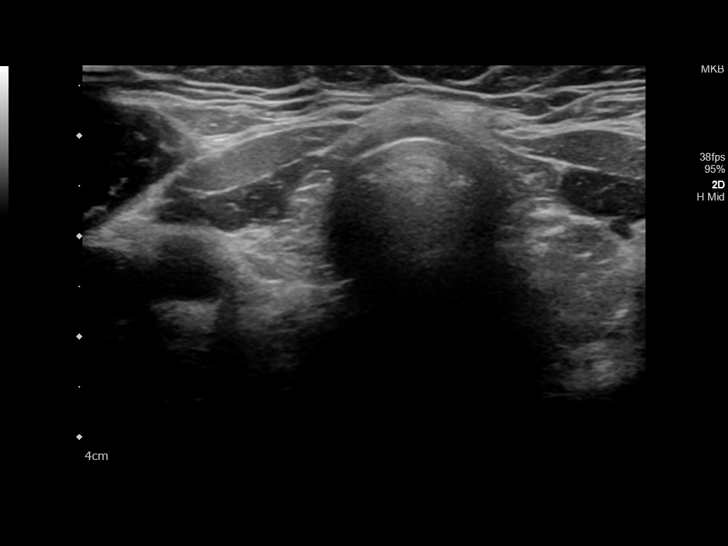
[im 12/32]
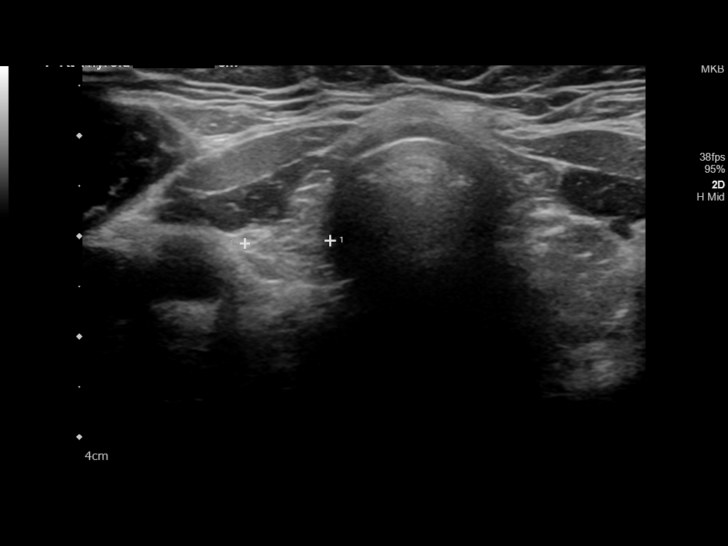
[im 15/32]
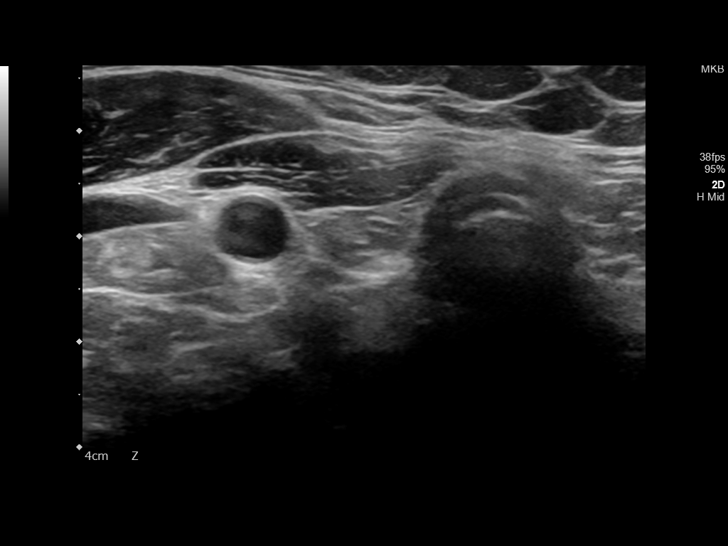
[im 17/32]
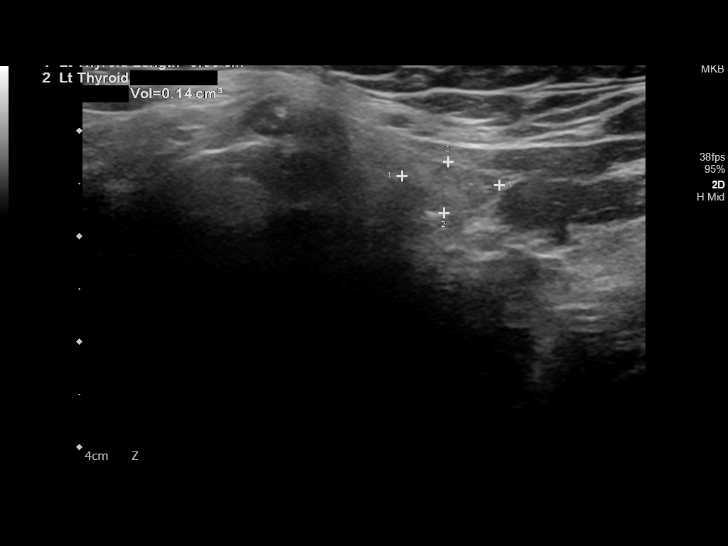
[im 20/32]
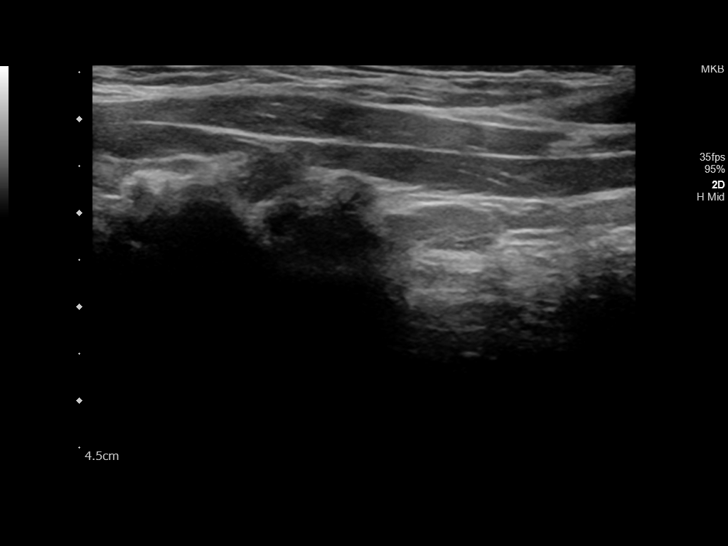
[im 21/32]
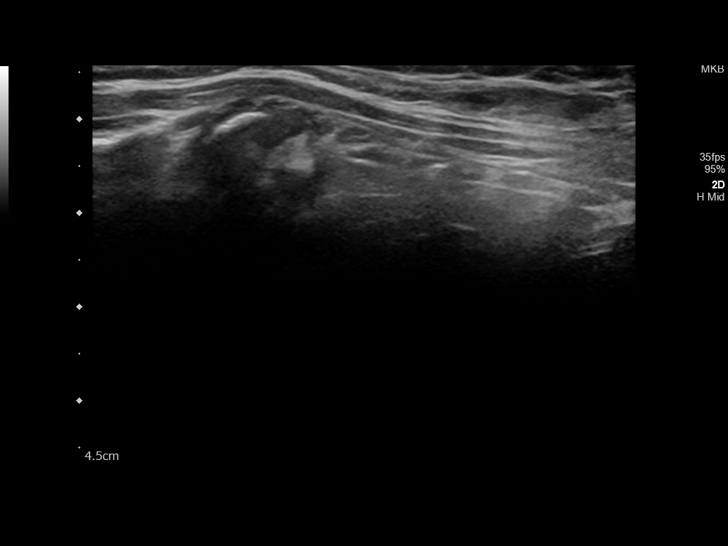
[im 24/32]
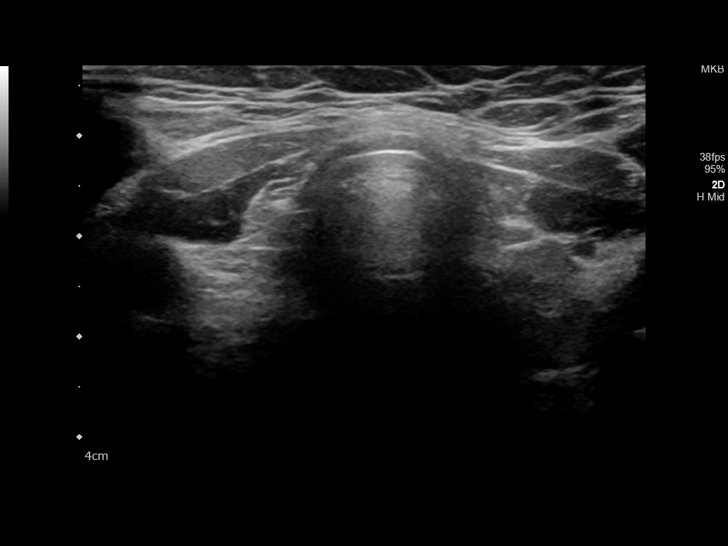
[im 26/32]
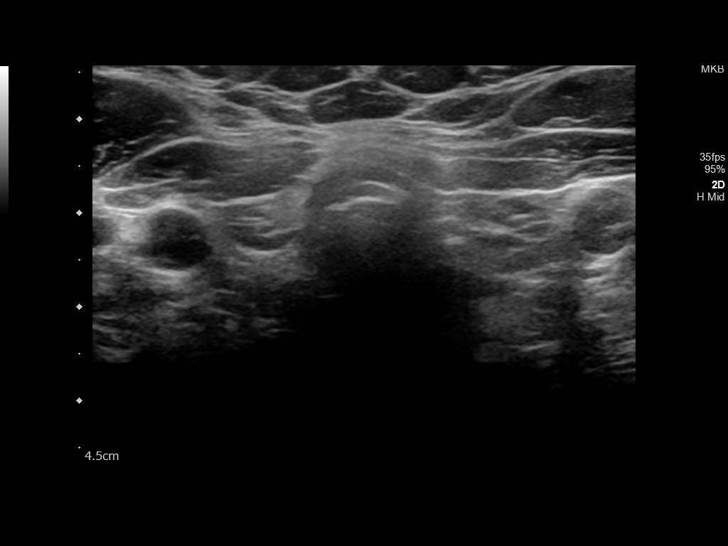
[im 29/32]
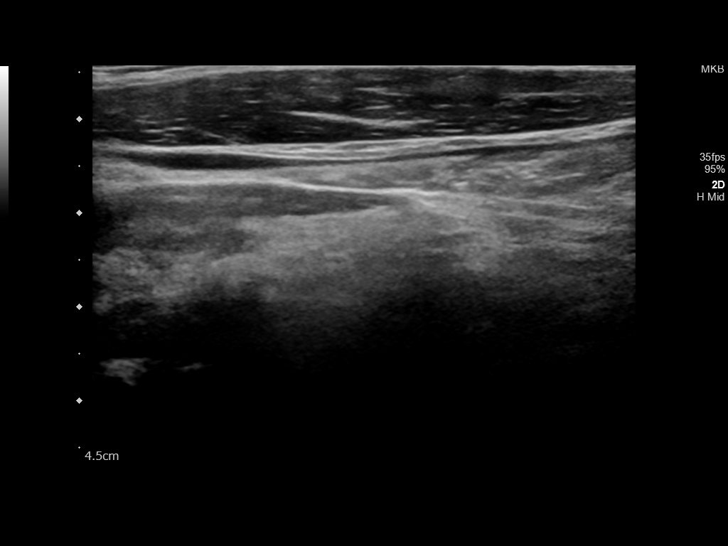
[im 32/32]
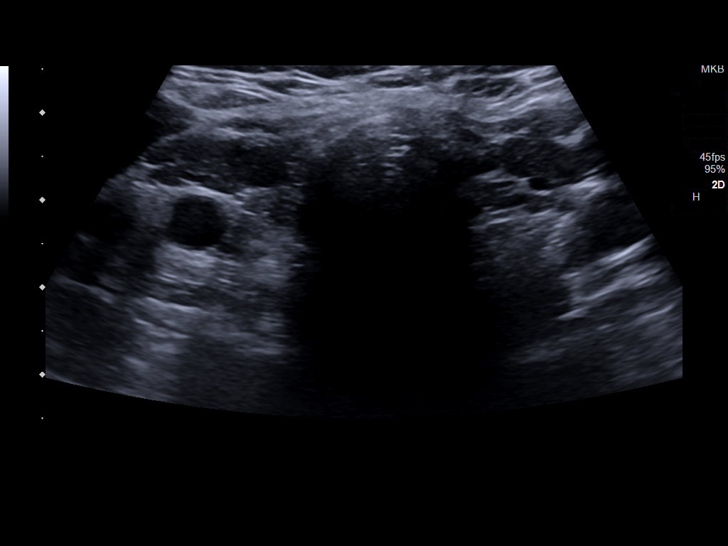

[14 of 25 positions shown; findings below may reference images not displayed]

FINDINGS: Parenchymal Echotexture: Markedly heterogenous

Isthmus: 3 mm

Right lobe: 1.7 x 0.7 x 0.9 cm

Left lobe: 0.9 x 0.5 x 0.6 cm

_________________________________________________________

Estimated total number of nodules >/= 1 cm: 0

Number of spongiform nodules >/=  2 cm not described below (TR1): 0

Number of mixed cystic and solid nodules >/= 1.5 cm not described
below (TR2): 0

_________________________________________________________

Thyroid gland is markedly heterogeneous and atrophic without
discrete nodule or focal abnormality. No hypervascularity. No
regional adenopathy.
IMPRESSION: Nonspecific thyroid heterogeneity and atrophy. No discrete nodule or
focal abnormality.

The above is in keeping with the ACR TI-RADS recommendations - [HOSPITAL] 7352;[DATE].

## 2021-11-10 ENCOUNTER — Ambulatory Visit
Admission: EM | Admit: 2021-11-10 | Discharge: 2021-11-10 | Disposition: A | Discharge status: Home / Self Care | Payer: 59 | Attending: Nurse Practitioner | Admitting: Nurse Practitioner

## 2021-11-10 ENCOUNTER — Encounter: Payer: Self-pay | Admitting: Emergency Medicine

## 2021-11-10 ENCOUNTER — Ambulatory Visit (INDEPENDENT_AMBULATORY_CARE_PROVIDER_SITE_OTHER): Payer: 59

## 2021-11-10 DIAGNOSIS — R059 Cough, unspecified: Secondary | ICD-10-CM

## 2021-11-10 DIAGNOSIS — B9689 Other specified bacterial agents as the cause of diseases classified elsewhere: Secondary | ICD-10-CM

## 2021-11-10 DIAGNOSIS — R5383 Other fatigue: Secondary | ICD-10-CM

## 2021-11-10 DIAGNOSIS — J329 Chronic sinusitis, unspecified: Secondary | ICD-10-CM | POA: Diagnosis not present

## 2021-11-10 DIAGNOSIS — R509 Fever, unspecified: Secondary | ICD-10-CM | POA: Diagnosis not present

## 2021-11-10 MED ORDER — AMOXICILLIN-POT CLAVULANATE 875-125 MG PO TABS: 1.0000 | ORAL_TABLET | Freq: Two times a day (BID) | ORAL | 0 refills | Status: AC

## 2021-11-10 NOTE — ED Triage Notes (Signed)
Nasal drainage over the past month.  Fever off and on over the past week with nausea.  States this is how he felt the last time he had pneumonia.  States cough has become worse with productive cough.

## 2021-11-10 NOTE — ED Provider Notes (Signed)
[facility]    CSN: [id number] Arrival date & time: 11/10/21  1017      History   Chief Complaint No chief complaint on file.   HPI Shane Bass is a 46 y.o. male.   Patient presents with 2 weeks of postnasal drainage which he reports is normal for him "this time of year."  Also endorses feeling poorly for the last week.  Reports he feels clammy, has episodes of nausea and body aches/chills for the past few days intermittently.  Also endorses fever of 102 F at home.  Endorses a productive cough worse in the morning, more throat clearing during the day.  Also endorses some shortness of breath, no wheezing, chest pain or tightness though.  Feels that he has nasal congestion draining into his throat.  No runny nose, sore throat, ear pain or pressure, abdominal pain, vomiting, diarrhea, or decreased appetite.  No new rash.  He does have a headache and some increased fatigue.  Has tried ibuprofen and saline rinses for his symptoms with minimal relief.  He has taken multiple COVID-19 test at home all of which have been negative.  Patient reports he had a "touch" of asthma when he was a child.  He had an albuterol inhaler that he uses very rarely as a child.  Reports a history of walking pneumonia about 7 years ago.  Also reports he gets "bronchitis" every year.  Reports his father had pulmonary fibrosis.    History reviewed. No pertinent past medical history.  There are no problems to display for this patient.   History reviewed. No pertinent surgical history.     Home Medications    Prior to Admission medications   Medication Sig Start Date End Date Taking? Authorizing Provider  amoxicillin-clavulanate (AUGMENTIN) 875-125 MG tablet Take 1 tablet by mouth 2 (two) times daily for 7 days. 11/10/21 11/17/21 Yes Valentino Nose, NP    Family History History reviewed. No pertinent family history.  Social History Social History   Tobacco Use   Smoking status: Every  Day   Smokeless tobacco: Never     Allergies   Patient has no known allergies.   Review of Systems Review of Systems Per HPI  Physical Exam Triage Vital Signs ED Triage Vitals  Enc Vitals Group     BP 11/10/21 1108 110/74     Pulse Rate 11/10/21 1108 75     Resp 11/10/21 1108 16     Temp 11/10/21 1108 98.7 F (37.1 C)     Temp Source 11/10/21 1108 Oral     SpO2 11/10/21 1108 93 %     Weight --      Height --      Head Circumference --      Peak Flow --      Pain Score 11/10/21 1110 0     Pain Loc --      Pain Edu? --      Excl. in GC? --    No data found.  Updated Vital Signs BP 110/74 (BP Location: Right Arm)   Pulse 75   Temp 98.7 F (37.1 C) (Oral)   Resp 16   SpO2 93%   Visual Acuity Right Eye Distance:   Left Eye Distance:   Bilateral Distance:    Right Eye Near:   Left Eye Near:    Bilateral Near:     Physical Exam Vitals and nursing note reviewed.  Constitutional:      General: He is  not in acute distress.    Appearance: Normal appearance. He is not ill-appearing or toxic-appearing.  HENT:     Head: Normocephalic and atraumatic.     Right Ear: Tympanic membrane, ear canal and external ear normal. There is no impacted cerumen.     Left Ear: Tympanic membrane, ear canal and external ear normal. There is no impacted cerumen.     Nose: Congestion and rhinorrhea present.     Right Turbinates: Enlarged and swollen.     Left Turbinates: Enlarged and swollen.     Right Sinus: No maxillary sinus tenderness or frontal sinus tenderness.     Left Sinus: No maxillary sinus tenderness or frontal sinus tenderness.     Mouth/Throat:     Mouth: Mucous membranes are moist.     Pharynx: Oropharynx is clear. Posterior oropharyngeal erythema present. No oropharyngeal exudate or uvula swelling.     Tonsils: No tonsillar exudate or tonsillar abscesses. 0 on the left.  Eyes:     General: No scleral icterus.    Extraocular Movements: Extraocular movements intact.   Cardiovascular:     Rate and Rhythm: Normal rate and regular rhythm.  Pulmonary:     Effort: Pulmonary effort is normal. No respiratory distress.     Breath sounds: Normal breath sounds. No wheezing, rhonchi or rales.  Abdominal:     General: Abdomen is flat. Bowel sounds are normal. There is no distension.     Palpations: Abdomen is soft.  Skin:    General: Skin is warm and dry.     Capillary Refill: Capillary refill takes less than 2 seconds.     Coloration: Skin is not jaundiced or pale.     Findings: No erythema or rash.  Neurological:     Mental Status: He is alert and oriented to person, place, and time.  Psychiatric:        Behavior: Behavior is cooperative.      UC Treatments / Results  Labs (all labs ordered are listed, but only abnormal results are displayed) Labs Reviewed - No data to display  EKG   Radiology DG Chest 2 View  Result Date: 11/10/2021 CLINICAL DATA:  Productive cough for 1 month with fatigue and fever for 1 week. EXAM: CHEST - 2 VIEW COMPARISON:  02/27/2018 FINDINGS: Normal heart size. No pleural effusion or edema. No airspace opacities identified. Remote appearing healed left fifth rib fracture deformity. Postop change identified within the midthoracic spine. IMPRESSION: No active cardiopulmonary abnormalities. Electronically Signed   By: Signa Kell M.D.   On: 11/10/2021 11:23    Procedures Procedures (including critical care time)  Medications Ordered in UC Medications - No data to display  Initial Impression / Assessment and Plan / UC Course  I have reviewed the triage vital signs and the nursing notes.  Pertinent labs & imaging results that were available during my care of the patient were reviewed by me and considered in my medical decision making (see chart for details).    Patient is well-appearing, normotensive, afebrile, not tachycardic, not tachypneic, oxygenating well on room air.  Chest ray today is negative for acute  cardiopulmonary process.  Symptoms and examination are consistent with bacterial sinus infection.  Treat with Augmentin twice daily for 7 days.  Supportive care discussed.  Recommended starting allergy regimen with cetirizine and Flonase during season changes to prevent recurrent infections.  ER precautions and return precautions discussed.  The patient was given the opportunity to ask questions.  All questions answered  to their satisfaction.  The patient is in agreement to this plan.   Final Clinical Impressions(s) / UC Diagnoses   Final diagnoses:  Bacterial sinusitis     Discharge Instructions      The chest x-ray today is negative for pneumonia.  I suspect your symptoms are related to a bacterial sinus infection.  Please start the Augmentin and take it twice daily for 7 days.  Continue the nasal saline rinses as needed to help with the nasal congestion.  Continue plenty of hydration with water.  Start Mucinex 600 mg twice daily as needed for congestion to help thin out the secretions.  Seek care if your symptoms persist or worsen despite treatment.     ED Prescriptions     Medication Sig Dispense Auth. Provider   amoxicillin-clavulanate (AUGMENTIN) 875-125 MG tablet Take 1 tablet by mouth 2 (two) times daily for 7 days. 14 tablet Eulogio Bear, NP      PDMP not reviewed this encounter.   Eulogio Bear, NP 11/10/21 1156

## 2021-11-10 NOTE — Discharge Instructions (Addendum)
The chest x-ray today is negative for pneumonia.  I suspect your symptoms are related to a bacterial sinus infection.  Please start the Augmentin and take it twice daily for 7 days.  Continue the nasal saline rinses as needed to help with the nasal congestion.  Continue plenty of hydration with water.  Start Mucinex 600 mg twice daily as needed for congestion to help thin out the secretions.  Seek care if your symptoms persist or worsen despite treatment.
